# Patient Record
Sex: Female | Born: 1970 | Race: White | Hispanic: No | Marital: Single | Smoking: Never smoker
Health system: Southern US, Community
[De-identification: ages and names within clinical notes are randomized; demographics above are authoritative.]

## PROBLEM LIST (undated history)

## (undated) HISTORY — PX: ABDOMINAL HYSTERECTOMY: SHX81

---

## 2018-08-30 ENCOUNTER — Emergency Department (INDEPENDENT_AMBULATORY_CARE_PROVIDER_SITE_OTHER)
Admission: EM | Admit: 2018-08-30 | Discharge: 2018-08-30 | Disposition: A | Discharge status: Home / Self Care | Payer: Self-pay | Source: Home / Self Care

## 2018-08-30 ENCOUNTER — Emergency Department (INDEPENDENT_AMBULATORY_CARE_PROVIDER_SITE_OTHER): Payer: Self-pay

## 2018-08-30 ENCOUNTER — Encounter: Payer: Self-pay | Admitting: *Deleted

## 2018-08-30 DIAGNOSIS — R109 Unspecified abdominal pain: Secondary | ICD-10-CM

## 2018-08-30 DIAGNOSIS — R0781 Pleurodynia: Secondary | ICD-10-CM

## 2018-08-30 LAB — POCT CBC W AUTO DIFF (K'VILLE URGENT CARE)

## 2018-08-30 MED ORDER — ONDANSETRON 4 MG PO TBDP
4.0000 mg | ORAL_TABLET | Freq: Once | ORAL | Status: AC
  Administered 2018-08-30: 4 mg via ORAL

## 2018-08-30 NOTE — ED Notes (Signed)
Patient give ice chips, asked to not eat or drink until seen in ED. Patient given 4mg  ODT zofran prior to discharge for nausea.

## 2018-08-30 NOTE — ED Triage Notes (Addendum)
Patient reports waking up this am with right sided rib cage pain. Patient points underneath right breast and radiates around back. Patient states she was out last night and doesn't remember what happened but does not remember falling. Patient speaking in complete sentences but states does feel sob because of pain. No other injuries.

## 2018-08-30 NOTE — ED Notes (Signed)
Erin at bedside speaking with patient regarding POC. Patient is very uncomfortable and reports extreme discomfort, xray is still pending.

## 2018-08-30 NOTE — Discharge Instructions (Signed)
°  You have declined EMS transport.   Please allow your husband to drive you directly to the emergency department for further evaluation and treatment of your severe pain.  Do not eat or drink anything along the way.

## 2018-08-30 NOTE — ED Provider Notes (Signed)
[facility]    CSN: [id number] Arrival date & time: 08/30/18  1150     History   Chief Complaint Chief Complaint  Patient presents with  . Rib Injury    HPI Alanie Bray is a 48 y.o. female.   HPI  Kara Hogan is a 48 y.o. female presenting to UC with c/o severe sudden onset Right flank pain that woke her from her sleep this morning. Pain comes in waves but never completely resolves. Pain is sharp and stabbing, worse with deep breathing and certain movements. She did go to [place] last night and was drinking alcohol, she does not recall falling but now questions if she may have hit something and fractured a rib. Denies bruising or wounds to the side she is hurting. No other areas of pain.  Denies fever, chills, nausea or vomiting.  She was told a few years ago she may need her gallbladder removed.  She thinks the current pain somewhat feels the same.  She also reports having a URI this past week, dx with an asthma exacerbation.  Per medical records, pt was prescribed cough medication including Tussin but no antibiotics. Pt denies hx of kidney stones or urinary symptoms.       History reviewed. No pertinent past medical history.  There are no active problems to display for this patient.   Past Surgical History:  Procedure Laterality Date  . ABDOMINAL HYSTERECTOMY      OB History   No obstetric history on file.      Home Medications    Prior to Admission medications   Medication Sig Start Date End Date Taking? Authorizing Provider  FLUoxetine (PROZAC) 10 MG tablet Take 10 mg by mouth daily.   Yes [provider]  pravastatin (PRAVACHOL) 10 MG tablet Take 10 mg by mouth daily.   Yes [provider]    Family History History reviewed. No pertinent family history.  Social History Social History   Tobacco Use  . Smoking status: Never Smoker  . Smokeless tobacco: Never Used  Substance Use Topics  . Alcohol use: Yes    Comment:  occ  . Drug use: Not on file     Allergies   Patient has no known allergies.   Review of Systems Review of Systems  Constitutional: Negative for appetite change, chills and fever.  Gastrointestinal: Negative for abdominal pain, diarrhea, nausea and vomiting.  Genitourinary: Positive for flank pain (Right). Negative for dysuria, hematuria and urgency.  Musculoskeletal: Negative for back pain and myalgias.     Physical Exam Triage Vital Signs ED Triage Vitals [08/30/18 1205]  Enc Vitals Group     BP 109/69     Pulse Rate 91     Resp (!) 21     Temp 98.2 F (36.8 C)     Temp Source Oral     SpO2 99 %     Weight      Height      Head Circumference      Peak Flow      Pain Score 10     Pain Loc      Pain Edu?      Excl. in GC?    No data found.  Updated Vital Signs BP 109/69 (BP Location: Right Arm)   Pulse 91   Temp 98.2 F (36.8 C) (Oral)   Resp (!) 21   SpO2 99%   Visual Acuity Right Eye Distance:   Left Eye Distance:  Bilateral Distance:    Right Eye Near:   Left Eye Near:    Bilateral Near:     Physical Exam Vitals signs and nursing note reviewed.  Constitutional:      General: She is in acute distress.     Appearance: Normal appearance. She is well-developed.     Comments: Tearful, appears to be in severe pain at times. Leaning over holding Right flank. Pacing at times.  HENT:     Head: Normocephalic and atraumatic.  Neck:     Musculoskeletal: Normal range of motion.  Cardiovascular:     Rate and Rhythm: Normal rate and regular rhythm.  Pulmonary:     Effort: Pulmonary effort is normal.     Breath sounds: Normal breath sounds.    Chest:     Chest wall: Tenderness present.  Abdominal:     General: There is no distension.     Palpations: Abdomen is soft. There is no mass.     Tenderness: There is abdominal tenderness. There is no right CVA tenderness.     Hernia: No hernia is present.    Musculoskeletal: Normal range of motion.    Skin:    General: Skin is warm and dry.  Neurological:     Mental Status: She is alert and oriented to person, place, and time.  Psychiatric:        Behavior: Behavior normal.      UC Treatments / Results  Labs (all labs ordered are listed, but only abnormal results are displayed) Labs Reviewed  POCT CBC W AUTO DIFF ([facility])    EKG None  Radiology Dg Ribs Unilateral W/chest Right  Result Date: 08/30/2018 CLINICAL DATA:  Right-sided rib pain. EXAM: RIGHT RIBS AND CHEST - 3+ VIEW COMPARISON:  None. FINDINGS: No fracture or other bone lesions are seen involving the ribs. There is no evidence of pneumothorax or pleural effusion. Both lungs are clear. Heart size and mediastinal contours are within normal limits. IMPRESSION: Negative. Electronically Signed   By: Ted Mcalpine M.D.   On: 08/30/2018 12:58    Procedures Procedures (including critical care time)  Medications Ordered in UC Medications  ondansetron (ZOFRAN-ODT) disintegrating tablet 4 mg (4 mg Oral Given 08/30/18 1303)    Initial Impression / Assessment and Plan / UC Course  I have reviewed the triage vital signs and the nursing notes.  Pertinent labs & imaging results that were available during my care of the patient were reviewed by me and considered in my medical decision making (see chart for details).     CBC: WBC- 12.7*F  CXR unremarkable, no evidence of rib fracture or pneumothorax Suspect cholecystitis vs cholelithiasis given severity of pain and the waxing and waning of the pain. Hx of gallbladder issues.  Pt declined EMS transport but agrees to go to [facility] POV with friend and husband for further evaluation and treatment  Pt given 4mg  OTD Zofran at discharge as she started to develop nausea.  Final Clinical Impressions(s) / UC Diagnoses   Final diagnoses:  Right flank pain     Discharge Instructions      You have declined EMS transport.   Please allow your  husband to drive you directly to the emergency department for further evaluation and treatment of your severe pain.  Do not eat or drink anything along the way.    ED Prescriptions    None     Controlled Substance Prescriptions [place] Controlled Substance Registry consulted? Not Applicable  Lurene Shadow, [place] 08/30/18 1304

## 2018-08-30 NOTE — ED Notes (Signed)
Patient being discharged with family and friend to ED. Explained to patient importance of NPO status. Patient starting to get nauseas, ODT zofran given.

## 2020-05-28 IMAGING — DX DG RIBS W/ CHEST 3+V*R*
3 series · 3 of 3 positions shown · non-contrast
Comparison: None.

CLINICAL DATA: Right-sided rib pain.

EXAM:
RIGHT RIBS AND CHEST - 3+ VIEW

[chest pa]
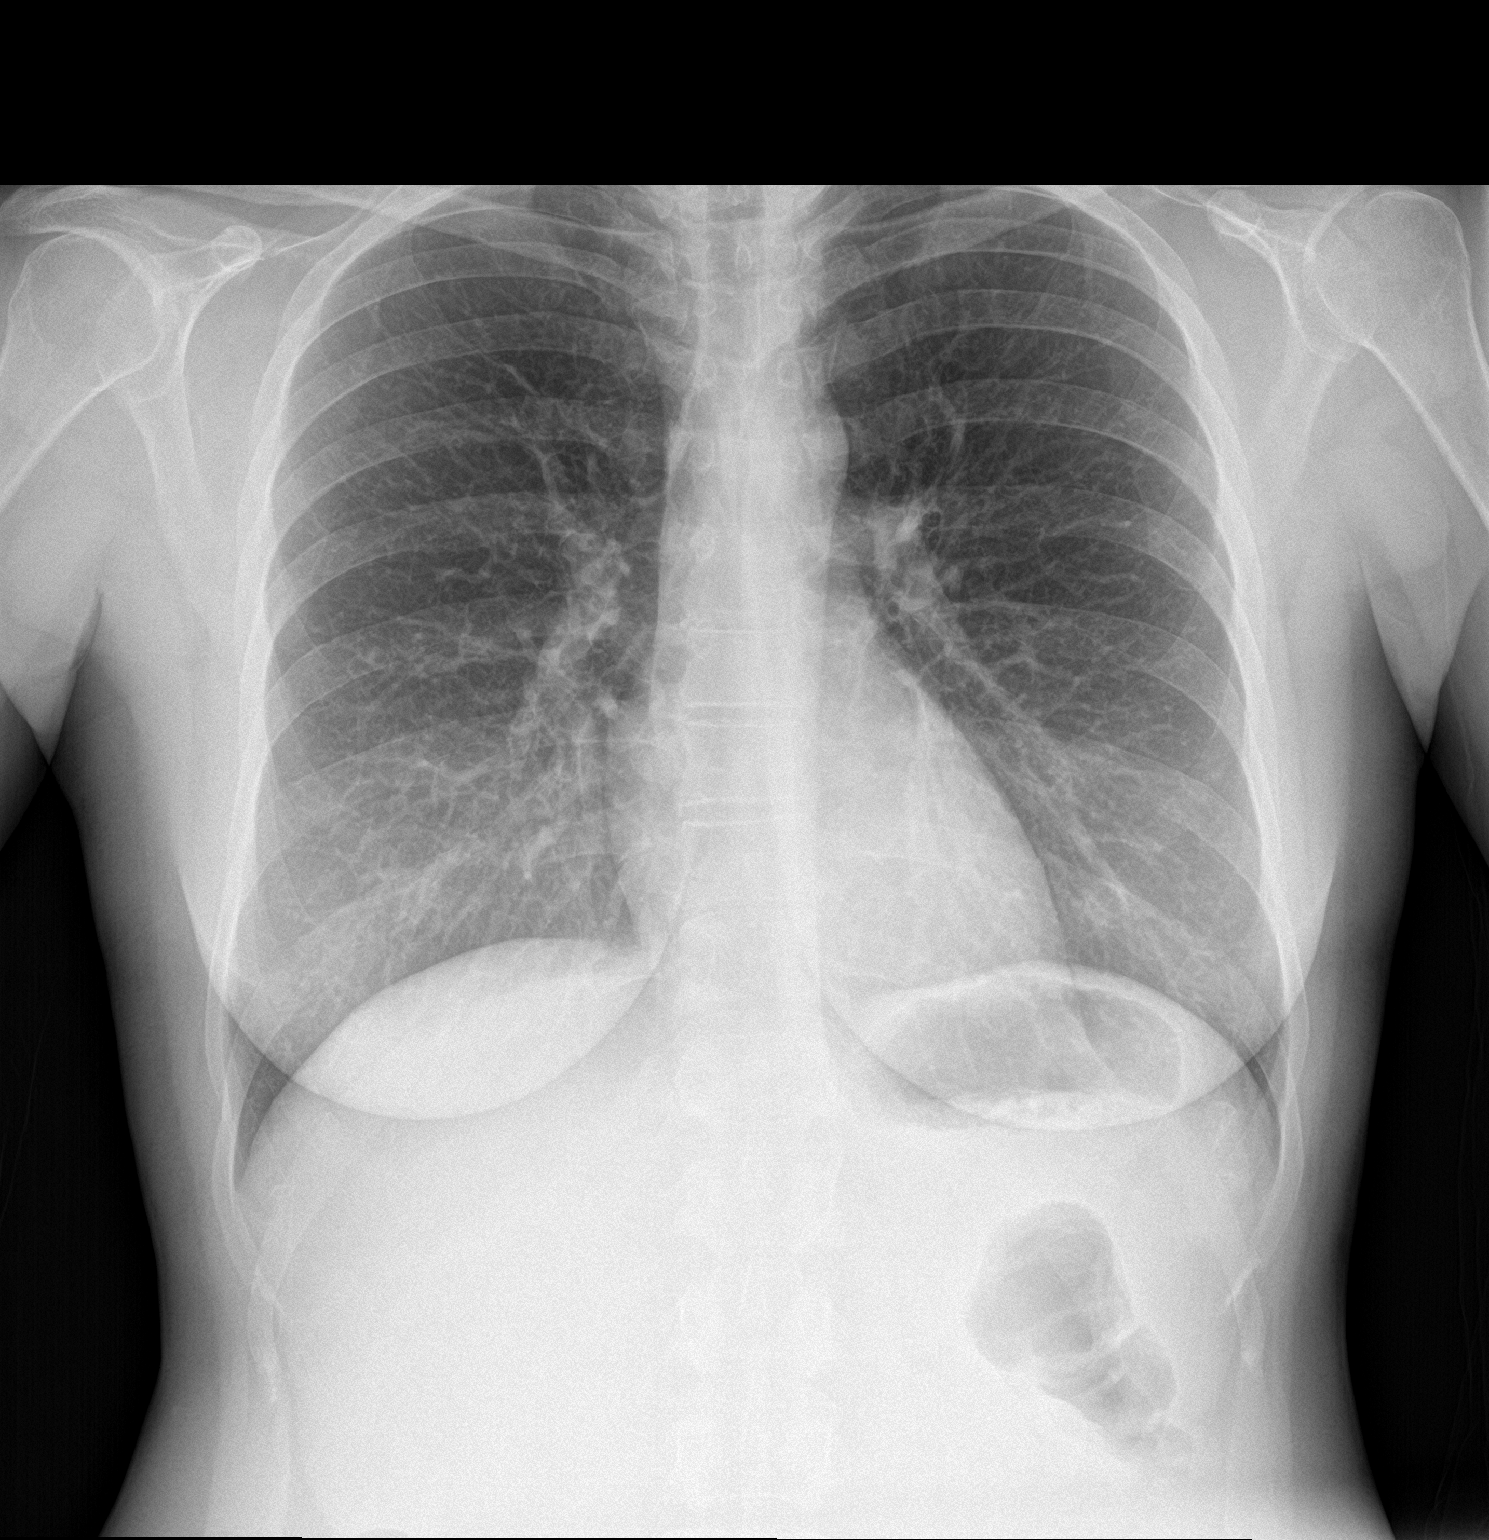

[rib ap]
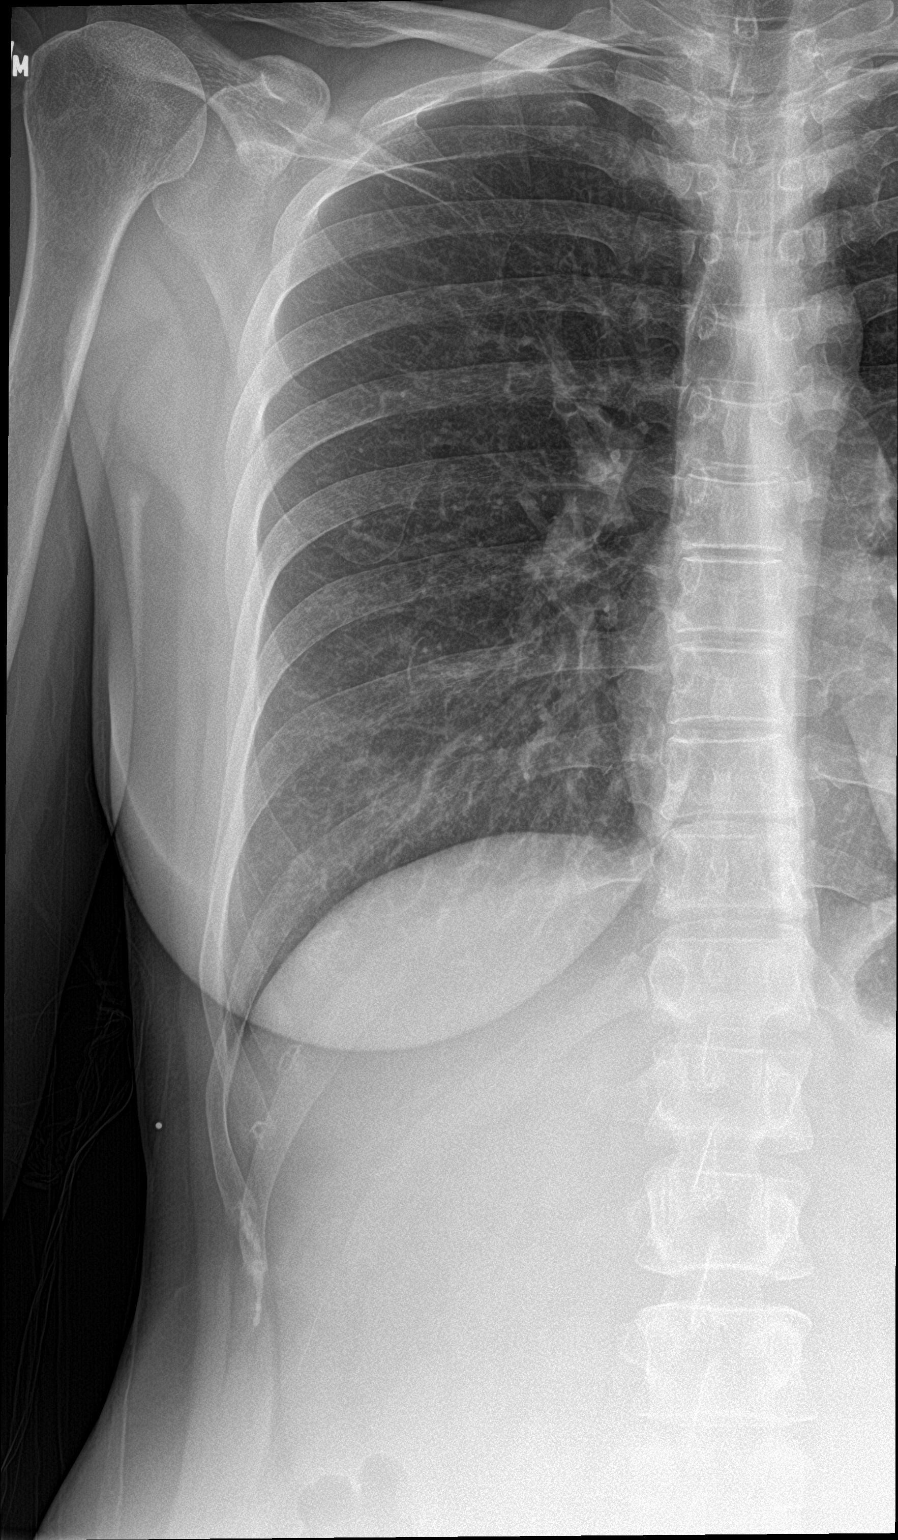

[rib ap obl]
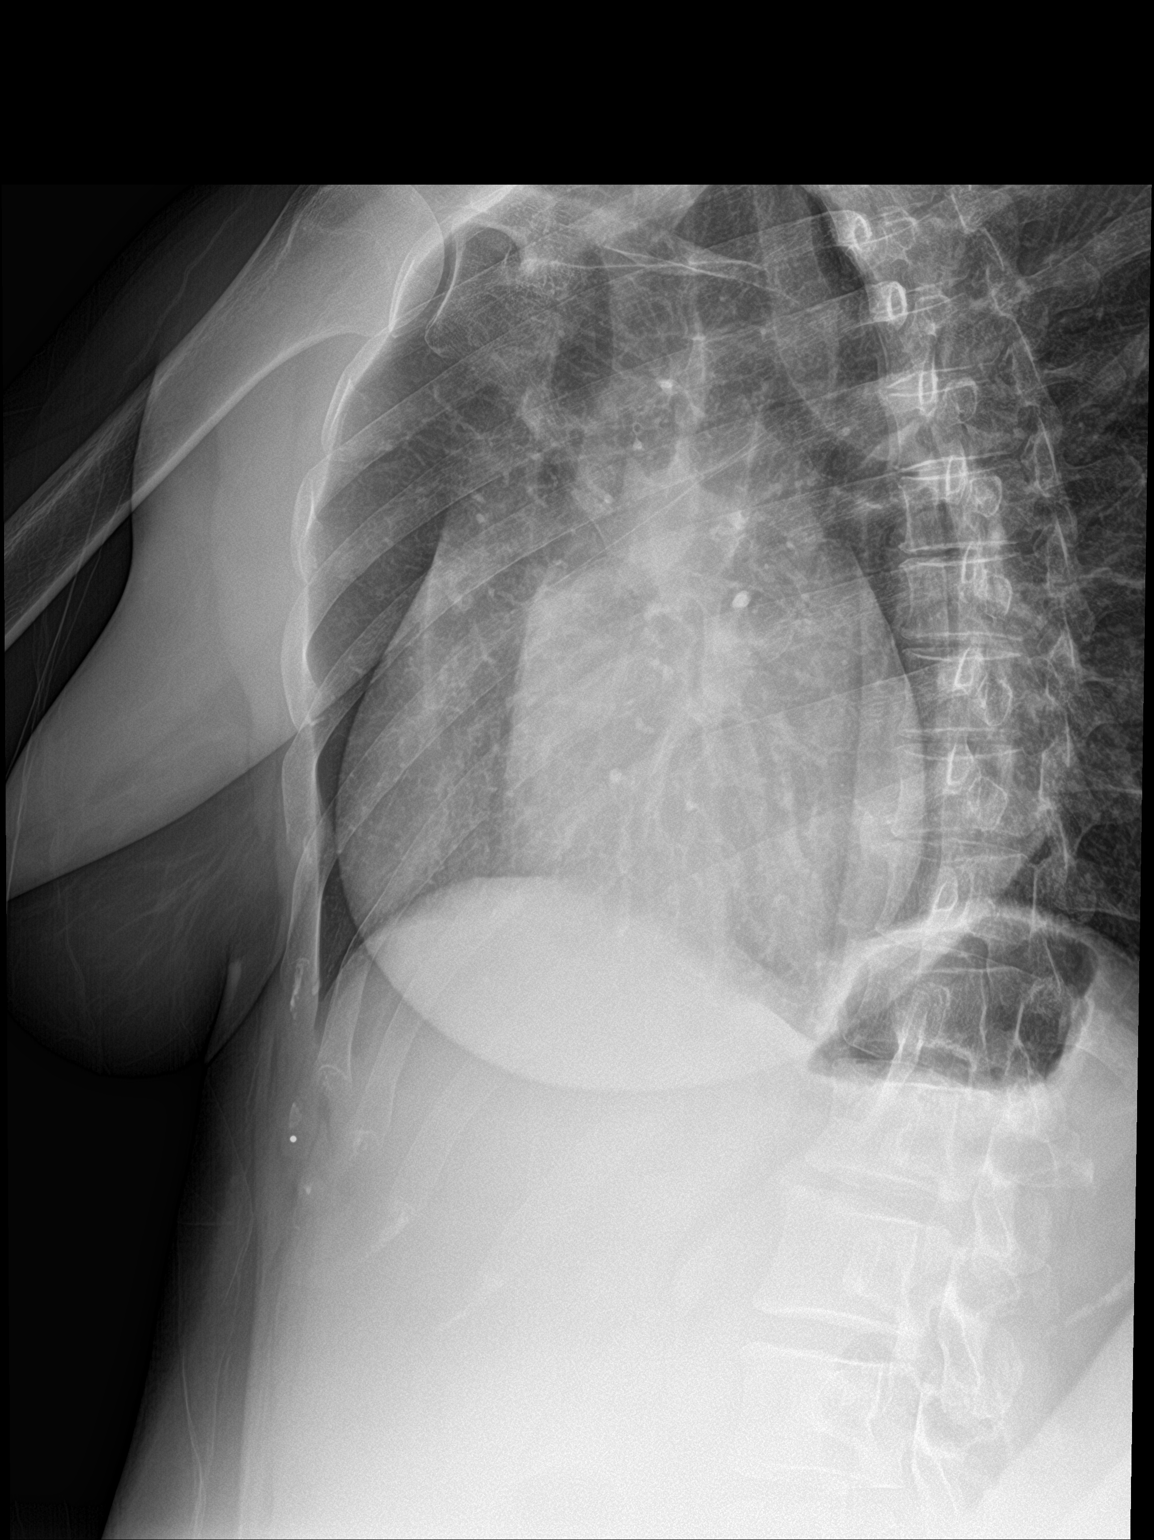

[3 of 3 positions shown; findings below may reference images not displayed]

FINDINGS: No fracture or other bone lesions are seen involving the ribs. There
is no evidence of pneumothorax or pleural effusion. Both lungs are
clear. Heart size and mediastinal contours are within normal limits.
IMPRESSION: Negative.
